# Patient Record
Sex: Female | Born: 1965 | Race: Black or African American | Hispanic: No | Marital: Married | Smoking: Never smoker
Health system: Southern US, Community
[De-identification: ages and names within clinical notes are randomized; demographics above are authoritative.]

## PROBLEM LIST (undated history)

## (undated) DIAGNOSIS — J45909 Unspecified asthma, uncomplicated: Secondary | ICD-10-CM

## (undated) DIAGNOSIS — E119 Type 2 diabetes mellitus without complications: Secondary | ICD-10-CM

## (undated) HISTORY — PX: TUBAL LIGATION: SHX77

---

## 2001-04-25 ENCOUNTER — Emergency Department (HOSPITAL_COMMUNITY): Admission: EM | Admit: 2001-04-25 | Discharge: 2001-04-25 | Payer: Self-pay | Admitting: Emergency Medicine

## 2002-06-29 ENCOUNTER — Emergency Department (HOSPITAL_COMMUNITY): Admission: EM | Admit: 2002-06-29 | Discharge: 2002-06-29 | Payer: Self-pay | Admitting: Emergency Medicine

## 2002-06-29 ENCOUNTER — Encounter: Payer: Self-pay | Admitting: Emergency Medicine

## 2008-01-13 ENCOUNTER — Emergency Department (HOSPITAL_COMMUNITY): Admission: EM | Admit: 2008-01-13 | Discharge: 2008-01-14 | Payer: Self-pay | Admitting: Emergency Medicine

## 2008-10-31 ENCOUNTER — Encounter: Admission: RE | Admit: 2008-10-31 | Discharge: 2008-10-31 | Payer: Self-pay | Admitting: Internal Medicine

## 2008-10-31 ENCOUNTER — Other Ambulatory Visit: Admission: RE | Admit: 2008-10-31 | Discharge: 2008-10-31 | Payer: Self-pay | Admitting: Obstetrics and Gynecology

## 2010-11-15 ENCOUNTER — Encounter: Payer: Self-pay | Admitting: Internal Medicine

## 2011-06-30 ENCOUNTER — Emergency Department (HOSPITAL_COMMUNITY)
Admission: EM | Admit: 2011-06-30 | Discharge: 2011-06-30 | Disposition: A | Discharge status: Home / Self Care | Payer: BC Managed Care – PPO | Attending: Emergency Medicine | Admitting: Emergency Medicine

## 2011-06-30 DIAGNOSIS — H9209 Otalgia, unspecified ear: Secondary | ICD-10-CM | POA: Insufficient documentation

## 2011-06-30 DIAGNOSIS — R599 Enlarged lymph nodes, unspecified: Secondary | ICD-10-CM | POA: Insufficient documentation

## 2011-06-30 DIAGNOSIS — J029 Acute pharyngitis, unspecified: Secondary | ICD-10-CM | POA: Insufficient documentation

## 2011-06-30 DIAGNOSIS — H6120 Impacted cerumen, unspecified ear: Secondary | ICD-10-CM | POA: Insufficient documentation

## 2011-06-30 DIAGNOSIS — M542 Cervicalgia: Secondary | ICD-10-CM | POA: Insufficient documentation

## 2011-06-30 DIAGNOSIS — R1310 Dysphagia, unspecified: Secondary | ICD-10-CM | POA: Insufficient documentation

## 2011-06-30 DIAGNOSIS — K219 Gastro-esophageal reflux disease without esophagitis: Secondary | ICD-10-CM | POA: Insufficient documentation

## 2011-06-30 LAB — RAPID STREP SCREEN (MED CTR MEBANE ONLY): Streptococcus, Group A Screen (Direct): NEGATIVE

## 2011-07-19 LAB — COMPREHENSIVE METABOLIC PANEL
ALT: 10
AST: 19
Albumin: 3.2 — ABNORMAL LOW
Alkaline Phosphatase: 81
BUN: 6
CO2: 29
Calcium: 8.8
Chloride: 104
Creatinine, Ser: 0.77
GFR calc Af Amer: >60
GFR calc non Af Amer: >60
Glucose, Bld: 136 — ABNORMAL HIGH
Potassium: 3.8
Sodium: 137
Total Bilirubin: 0.9
Total Protein: 6.3

## 2011-07-19 LAB — CBC
HCT: 35.8 — ABNORMAL LOW
Hemoglobin: 12
MCHC: 33.5
MCV: 92.9
Platelets: 259
RBC: 3.86 — ABNORMAL LOW
RDW: 12.9
WBC: 8.5

## 2011-07-19 LAB — DIFFERENTIAL
Basophils Absolute: 0
Basophils Relative: 0
Eosinophils Absolute: 0.3
Eosinophils Relative: 3
Lymphocytes Relative: 20
Lymphs Abs: 1.7
Monocytes Absolute: 0.4
Monocytes Relative: 5
Neutro Abs: 6.1
Neutrophils Relative %: 72

## 2011-07-19 LAB — LIPASE, BLOOD: Lipase: 13

## 2011-07-19 LAB — POCT CARDIAC MARKERS
CKMB, poc: <1 — ABNORMAL LOW
Myoglobin, poc: 39.4
Operator id: 294341
Troponin i, poc: <0.05

## 2014-04-15 ENCOUNTER — Other Ambulatory Visit: Payer: Self-pay | Admitting: Internal Medicine

## 2014-04-15 DIAGNOSIS — Z1231 Encounter for screening mammogram for malignant neoplasm of breast: Secondary | ICD-10-CM

## 2014-04-28 ENCOUNTER — Encounter (HOSPITAL_COMMUNITY): Payer: Self-pay | Admitting: Emergency Medicine

## 2014-04-28 ENCOUNTER — Emergency Department (HOSPITAL_COMMUNITY)
Admission: EM | Admit: 2014-04-28 | Discharge: 2014-04-28 | Disposition: A | Discharge status: Home / Self Care | Payer: BC Managed Care – PPO | Attending: Emergency Medicine | Admitting: Emergency Medicine

## 2014-04-28 DIAGNOSIS — R7309 Other abnormal glucose: Secondary | ICD-10-CM | POA: Insufficient documentation

## 2014-04-28 DIAGNOSIS — R739 Hyperglycemia, unspecified: Secondary | ICD-10-CM

## 2014-04-28 DIAGNOSIS — Y9289 Other specified places as the place of occurrence of the external cause: Secondary | ICD-10-CM | POA: Insufficient documentation

## 2014-04-28 DIAGNOSIS — T628X1A Toxic effect of other specified noxious substances eaten as food, accidental (unintentional), initial encounter: Secondary | ICD-10-CM | POA: Insufficient documentation

## 2014-04-28 DIAGNOSIS — T7840XA Allergy, unspecified, initial encounter: Secondary | ICD-10-CM

## 2014-04-28 DIAGNOSIS — L299 Pruritus, unspecified: Secondary | ICD-10-CM | POA: Insufficient documentation

## 2014-04-28 DIAGNOSIS — L551 Sunburn of second degree: Secondary | ICD-10-CM | POA: Insufficient documentation

## 2014-04-28 DIAGNOSIS — J45909 Unspecified asthma, uncomplicated: Secondary | ICD-10-CM | POA: Insufficient documentation

## 2014-04-28 DIAGNOSIS — Y9389 Activity, other specified: Secondary | ICD-10-CM | POA: Insufficient documentation

## 2014-04-28 HISTORY — DX: Unspecified asthma, uncomplicated: J45.909

## 2014-04-28 LAB — COMPREHENSIVE METABOLIC PANEL
ALT: 23 U/L (ref 0–35)
AST: 16 U/L (ref 0–37)
Albumin: 3.3 g/dL — ABNORMAL LOW (ref 3.5–5.2)
Alkaline Phosphatase: 100 U/L (ref 39–117)
Anion gap: 15 (ref 5–15)
BUN: 12 mg/dL (ref 6–23)
CO2: 22 mEq/L (ref 19–32)
Calcium: 8.9 mg/dL (ref 8.4–10.5)
Chloride: 97 mEq/L (ref 96–112)
Creatinine, Ser: 0.68 mg/dL (ref 0.50–1.10)
GFR calc Af Amer: >90 mL/min (ref >90)
GFR calc non Af Amer: >90 mL/min (ref >90)
Glucose, Bld: 333 mg/dL — ABNORMAL HIGH (ref 70–99)
Potassium: 4.2 mEq/L (ref 3.7–5.3)
Sodium: 134 mEq/L — ABNORMAL LOW (ref 137–147)
Total Bilirubin: 0.2 mg/dL — ABNORMAL LOW (ref 0.3–1.2)
Total Protein: 6.8 g/dL (ref 6.0–8.3)

## 2014-04-28 LAB — CBC WITH DIFFERENTIAL/PLATELET
Basophils Absolute: 0 10*3/uL (ref 0.0–0.1)
Basophils Relative: 0 % (ref 0–1)
Eosinophils Absolute: 0.2 10*3/uL (ref 0.0–0.7)
Eosinophils Relative: 2 % (ref 0–5)
HCT: 36.4 % (ref 36.0–46.0)
Hemoglobin: 12.8 g/dL (ref 12.0–15.0)
Lymphocytes Relative: 15 % (ref 12–46)
Lymphs Abs: 1.1 10*3/uL (ref 0.7–4.0)
MCH: 31.5 pg (ref 26.0–34.0)
MCHC: 35.2 g/dL (ref 30.0–36.0)
MCV: 89.7 fL (ref 78.0–100.0)
Monocytes Absolute: 0.2 10*3/uL (ref 0.1–1.0)
Monocytes Relative: 2 % — ABNORMAL LOW (ref 3–12)
Neutro Abs: 6 10*3/uL (ref 1.7–7.7)
Neutrophils Relative %: 81 % — ABNORMAL HIGH (ref 43–77)
Platelets: 167 10*3/uL (ref 150–400)
RBC: 4.06 MIL/uL (ref 3.87–5.11)
RDW: 12.6 % (ref 11.5–15.5)
WBC: 7.5 10*3/uL (ref 4.0–10.5)

## 2014-04-28 MED ORDER — METHYLPREDNISOLONE SODIUM SUCC 125 MG IJ SOLR
125.0000 mg | Freq: Once | INTRAMUSCULAR | Status: AC
  Administered 2014-04-28: 125 mg via INTRAVENOUS
  Filled 2014-04-28: qty 2

## 2014-04-28 MED ORDER — SILVER SULFADIAZINE 1 % EX CREA
TOPICAL_CREAM | Freq: Two times a day (BID) | CUTANEOUS | Status: DC
  Administered 2014-04-28: 14:00:00 via TOPICAL
  Filled 2014-04-28: qty 85

## 2014-04-28 MED ORDER — IBUPROFEN 600 MG PO TABS: 600.0000 mg | ORAL_TABLET | Freq: Four times a day (QID) | ORAL | Status: DC | PRN

## 2014-04-28 MED ORDER — DIPHENHYDRAMINE HCL 50 MG/ML IJ SOLN
25.0000 mg | Freq: Once | INTRAMUSCULAR | Status: AC
  Administered 2014-04-28: 25 mg via INTRAVENOUS
  Filled 2014-04-28: qty 1

## 2014-04-28 MED ORDER — METFORMIN HCL 500 MG PO TABS: 500.0000 mg | ORAL_TABLET | Freq: Every day | ORAL | Status: DC

## 2014-04-28 MED ORDER — FAMOTIDINE IN NACL 20-0.9 MG/50ML-% IV SOLN
20.0000 mg | Freq: Once | INTRAVENOUS | Status: AC
  Administered 2014-04-28: 20 mg via INTRAVENOUS
  Filled 2014-04-28: qty 50

## 2014-04-28 NOTE — ED Provider Notes (Addendum)
CSN: [id number]     Arrival date & time 04/28/14  1243 History   First MD Initiated Contact with Patient 04/28/14 1321     Chief Complaint  Patient presents with  . Allergic Reaction     (Consider location/radiation/quality/duration/timing/severity/associated sxs/prior Treatment) HPI Comments: Also pt states she was out in the sun all day yesterday in her tanktop and did not wear sunscreen.  This morning when she woke up her bilateral arms with swollen, blistered and burning.  Also noticed redness and warmth over her chest.  Patient is a 48 y.o. female presenting with allergic reaction. The history is provided by the patient.  Allergic Reaction Presenting symptoms: itching   Presenting symptoms comment:  Dry cough and feels like a lump in the throat after eating multiple foods at a 4th of July bbq.  Also known reaction to citrus and had citrus beverages yesterday and today Severity:  Moderate Prior allergic episodes:  Food/nut allergies Context: food   Relieved by:  Nothing Worsened by:  Nothing tried Ineffective treatments:  Antihistamines   Past Medical History  Diagnosis Date  . Asthma    Past Surgical History  Procedure Laterality Date  . Tubal ligation     No family history on file. History  Substance Use Topics  . Smoking status: Never Smoker   . Smokeless tobacco: Not on file  . Alcohol Use: Yes   OB History   Grav Para Term Preterm Abortions TAB SAB Ect Mult Living                 Review of Systems  Skin: Positive for itching.  All other systems reviewed and are negative.     Allergies  Citric acid; Lemonade flavor; Red dye; and Tomato  Home Medications   Prior to Admission medications   Not on File   BP 130/83  Pulse 103  Temp(Src) 98.1 F (36.7 C) (Oral)  Resp 13  SpO2 100%  LMP 04/08/2014 Physical Exam  Nursing note and vitals reviewed. Constitutional: She is oriented to person, place, and time. She appears well-developed and  well-nourished. No distress.  HENT:  Head: Normocephalic and atraumatic.  Mouth/Throat: Oropharynx is clear and moist. No uvula swelling.  No tongue swelling  Eyes: Conjunctivae and EOM are normal. Pupils are equal, round, and reactive to light.  Neck: Normal range of motion. Neck supple.  Cardiovascular: Normal rate, regular rhythm and intact distal pulses.   No murmur heard. Pulmonary/Chest: Effort normal and breath sounds normal. No respiratory distress. She has no wheezes. She has no rales.  Abdominal: Soft. She exhibits no distension. There is no tenderness. There is no rebound and no guarding.  Musculoskeletal: Normal range of motion. She exhibits no edema and no tenderness.  Neurological: She is alert and oriented to person, place, and time.  Skin: Skin is warm and dry. Rash noted. There is erythema.  Bilateral forearms with swelling, erythema, warmth and bulla with clear drainage.  Erythema to the upper chest that is also warm.  Normal area of skin where pt's tanktop was and no rash over the legs or abd  Psychiatric: She has a normal mood and affect. Her behavior is normal.    ED Course  Procedures (including critical care time) Labs Review Labs Reviewed  COMPREHENSIVE METABOLIC PANEL - Abnormal; Notable for the following:    Sodium 134 (*)    Glucose, Bld 333 (*)    Albumin 3.3 (*)    Total Bilirubin 0.2 (*)  All other components within normal limits  CBC WITH DIFFERENTIAL - Abnormal; Notable for the following:    Neutrophils Relative % 81 (*)    Monocytes Relative 2 (*)    All other components within normal limits    Imaging Review No results found.   EKG Interpretation None      MDM   Final diagnoses:  Sunburn of second degree  Allergic reaction, initial encounter  Hyperglycemia    Patient with 2 separate things going on. Initially after eating different foods at a Fourth of July picnic she developed some itching around her neck a dry cough and a feeling  of a lump in her throat has progressed overnight and did not improve with Benadryl. On exam patient is not wheezing as 98% oxygen saturation and has no oral swelling. Will treat with steroids, Pepcid and Benadryl.  Secondly patient is complaining of redness, blistering and pain to bilateral upper extremities. Patient states that she was outside all day long yesterday and did not wear sunscreen. It looks like a severe second degree sunburns. It does not itch.  It is hot to the touch. Patient denies any sulfa antibiotic use and states she was on azithromycin a week ago she finished the course and had no problems. The redness and blistering is only on her upper extremities. Also redness on her chest area with a visible line where her tank top was.  Will place silvadine cream  3:20 PM Pt feeling better and throat tightness gone.  On labs pt found to be hyperglycemic and states she was going back to her [facility] in aug for a check on her blood sugar.  Will start on metformin.  Will treat sunburn and will have pt use benadryl as needed for allergic reaction.  Gwyneth SproutWhitney Leisha Trinkle, MD 04/28/14 1521  Gwyneth SproutWhitney Cedric Denison, MD 04/28/14 1524

## 2014-04-28 NOTE — Discharge Instructions (Signed)
Allergies  Allergies may happen from anything your body is sensitive to. This may be food, medicines, pollens, chemicals, and many other things. Food allergies can be severe and deadly.  HOME CARE  If you do not know what causes a reaction, keep a diary. Write down the foods you ate and the symptoms that followed. Avoid foods that cause reactions.  If you have red raised spots (hives) or a rash:  Take medicine as told by your doctor.  Use medicines for red raised spots and itching as needed.  Apply cold cloths (compresses) to the skin. Take a cool bath. Avoid hot baths or showers.  If you are severely allergic:  It is often necessary to go to the hospital after you have treated your reaction.  Wear your medical alert jewelry.  You and your family must learn how to give a allergy shot or use an allergy kit (anaphylaxis kit).  Always carry your allergy kit or shot with you. Use this medicine as told by your doctor if a severe reaction is occurring. GET HELP RIGHT AWAY IF:  You have trouble breathing or are making high-pitched whistling sounds (wheezing).  You have a tight feeling in your chest or throat.  You have a puffy (swollen) mouth.  You have red raised spots, puffiness (swelling), or itching all over your body.  You have had a severe reaction that was helped by your allergy kit or shot. The reaction can return once the medicine has worn off.  You think you are having a food allergy. Symptoms most often happen within 30 minutes of eating a food.  Your symptoms have not gone away within 2 days or are getting worse.  You have new symptoms.  You want to retest yourself with a food or drink you think causes an allergic reaction. Only do this under the care of a doctor. MAKE SURE YOU:   Understand these instructions.  Will watch your condition.  Will get help right away if you are not doing well or get worse. Document Released: 02/05/2013 Document Reviewed:  02/05/2013 [facility] Patient Information 2015 [place]. This information is not intended to replace advice given to you by your health care provider. Make sure you discuss any questions you have with your health care provider.

## 2014-04-28 NOTE — ED Notes (Signed)
Pt reports that she is having a allergic reaction onset today. Pt presents with blistering of skin on her arms and redness between breasts. Pt thinks could be as result of food she ate yesterday.

## 2014-06-18 ENCOUNTER — Other Ambulatory Visit: Payer: Self-pay | Admitting: Obstetrics and Gynecology

## 2014-06-18 ENCOUNTER — Other Ambulatory Visit (HOSPITAL_COMMUNITY)
Admission: RE | Admit: 2014-06-18 | Discharge: 2014-06-18 | Disposition: A | Discharge status: Home / Self Care | Payer: BC Managed Care – PPO | Source: Ambulatory Visit | Attending: Obstetrics and Gynecology | Admitting: Obstetrics and Gynecology

## 2014-06-18 ENCOUNTER — Ambulatory Visit
Admission: RE | Admit: 2014-06-18 | Discharge: 2014-06-18 | Disposition: A | Discharge status: Home / Self Care | Payer: BC Managed Care – PPO | Source: Ambulatory Visit | Attending: Internal Medicine | Admitting: Internal Medicine

## 2014-06-18 DIAGNOSIS — Z1151 Encounter for screening for human papillomavirus (HPV): Secondary | ICD-10-CM | POA: Insufficient documentation

## 2014-06-18 DIAGNOSIS — Z01419 Encounter for gynecological examination (general) (routine) without abnormal findings: Secondary | ICD-10-CM | POA: Insufficient documentation

## 2014-06-18 DIAGNOSIS — Z1231 Encounter for screening mammogram for malignant neoplasm of breast: Secondary | ICD-10-CM

## 2014-06-24 LAB — CYTOLOGY - PAP

## 2014-07-02 ENCOUNTER — Ambulatory Visit: Payer: BC Managed Care – PPO

## 2014-07-09 ENCOUNTER — Ambulatory Visit: Payer: BC Managed Care – PPO

## 2014-07-16 ENCOUNTER — Ambulatory Visit: Payer: BC Managed Care – PPO

## 2015-05-01 ENCOUNTER — Other Ambulatory Visit: Payer: Self-pay | Admitting: Gastroenterology

## 2015-08-04 ENCOUNTER — Other Ambulatory Visit: Payer: Self-pay

## 2015-08-04 DIAGNOSIS — Z1231 Encounter for screening mammogram for malignant neoplasm of breast: Secondary | ICD-10-CM

## 2015-08-08 ENCOUNTER — Encounter (HOSPITAL_COMMUNITY): Payer: Self-pay | Admitting: *Deleted

## 2015-08-18 ENCOUNTER — Other Ambulatory Visit: Payer: Self-pay | Admitting: Gastroenterology

## 2015-08-18 ENCOUNTER — Ambulatory Visit (HOSPITAL_COMMUNITY)
Admission: RE | Admit: 2015-08-18 | Payer: BC Managed Care – PPO | Source: Ambulatory Visit | Admitting: Gastroenterology

## 2015-08-18 HISTORY — DX: Type 2 diabetes mellitus without complications: E11.9

## 2015-08-18 SURGERY — COLONOSCOPY WITH PROPOFOL
Anesthesia: Monitor Anesthesia Care

## 2015-08-18 MED ORDER — PROPOFOL 10 MG/ML IV BOLUS
INTRAVENOUS | Status: AC
  Filled 2015-08-18: qty 20

## 2015-08-20 ENCOUNTER — Ambulatory Visit
Admission: RE | Admit: 2015-08-20 | Discharge: 2015-08-20 | Disposition: A | Discharge status: Home / Self Care | Payer: BC Managed Care – PPO | Source: Ambulatory Visit

## 2015-08-20 DIAGNOSIS — Z1231 Encounter for screening mammogram for malignant neoplasm of breast: Secondary | ICD-10-CM

## 2015-10-29 ENCOUNTER — Encounter (HOSPITAL_COMMUNITY): Payer: Self-pay | Admitting: *Deleted

## 2015-11-10 ENCOUNTER — Encounter (HOSPITAL_COMMUNITY): Payer: Self-pay

## 2015-11-10 ENCOUNTER — Encounter (HOSPITAL_COMMUNITY)
Admission: RE | Disposition: A | Discharge status: Home / Self Care | Payer: Self-pay | Source: Ambulatory Visit | Attending: Gastroenterology

## 2015-11-10 ENCOUNTER — Ambulatory Visit (HOSPITAL_COMMUNITY)
Admission: RE | Admit: 2015-11-10 | Discharge: 2015-11-10 | Disposition: A | Discharge status: Home / Self Care | Payer: BC Managed Care – PPO | Source: Ambulatory Visit | Attending: Gastroenterology | Admitting: Gastroenterology

## 2015-11-10 ENCOUNTER — Ambulatory Visit (HOSPITAL_COMMUNITY): Payer: BC Managed Care – PPO | Admitting: Anesthesiology

## 2015-11-10 DIAGNOSIS — E119 Type 2 diabetes mellitus without complications: Secondary | ICD-10-CM | POA: Diagnosis not present

## 2015-11-10 DIAGNOSIS — K219 Gastro-esophageal reflux disease without esophagitis: Secondary | ICD-10-CM | POA: Insufficient documentation

## 2015-11-10 DIAGNOSIS — Z1211 Encounter for screening for malignant neoplasm of colon: Secondary | ICD-10-CM | POA: Diagnosis not present

## 2015-11-10 DIAGNOSIS — J45909 Unspecified asthma, uncomplicated: Secondary | ICD-10-CM | POA: Diagnosis not present

## 2015-11-10 DIAGNOSIS — E7800 Pure hypercholesterolemia, unspecified: Secondary | ICD-10-CM | POA: Diagnosis not present

## 2015-11-10 DIAGNOSIS — Z7984 Long term (current) use of oral hypoglycemic drugs: Secondary | ICD-10-CM | POA: Diagnosis not present

## 2015-11-10 HISTORY — PX: COLONOSCOPY WITH PROPOFOL: SHX5780

## 2015-11-10 LAB — GLUCOSE, CAPILLARY: Glucose-Capillary: 166 mg/dL — ABNORMAL HIGH (ref 65–99)

## 2015-11-10 SURGERY — COLONOSCOPY WITH PROPOFOL
Anesthesia: Monitor Anesthesia Care

## 2015-11-10 MED ORDER — LIDOCAINE HCL (CARDIAC) 20 MG/ML IV SOLN
INTRAVENOUS | Status: AC
  Filled 2015-11-10: qty 5

## 2015-11-10 MED ORDER — LIDOCAINE HCL (CARDIAC) 20 MG/ML IV SOLN
INTRAVENOUS | Status: DC | PRN
  Administered 2015-11-10: 50 mg via INTRAVENOUS

## 2015-11-10 MED ORDER — PROPOFOL 10 MG/ML IV BOLUS
INTRAVENOUS | Status: AC
  Filled 2015-11-10: qty 40

## 2015-11-10 MED ORDER — LACTATED RINGERS IV SOLN
INTRAVENOUS | Status: DC
  Administered 2015-11-10: 1000 mL via INTRAVENOUS

## 2015-11-10 MED ORDER — PROPOFOL 10 MG/ML IV BOLUS
INTRAVENOUS | Status: DC | PRN
  Administered 2015-11-10: 20 mg via INTRAVENOUS
  Administered 2015-11-10 (×3): 30 mg via INTRAVENOUS
  Administered 2015-11-10: 50 mg via INTRAVENOUS
  Administered 2015-11-10: 20 mg via INTRAVENOUS
  Administered 2015-11-10: 30 mg via INTRAVENOUS
  Administered 2015-11-10 (×2): 20 mg via INTRAVENOUS
  Administered 2015-11-10: 30 mg via INTRAVENOUS

## 2015-11-10 MED ORDER — SODIUM CHLORIDE 0.9 % IV SOLN: INTRAVENOUS | Status: DC

## 2015-11-10 SURGICAL SUPPLY — 22 items

## 2015-11-10 NOTE — Discharge Instructions (Signed)

## 2015-11-10 NOTE — Anesthesia Postprocedure Evaluation (Signed)
Anesthesia Post Note  Patient: Madison Scott  Procedure(s) Performed: Procedure(s) (LRB): COLONOSCOPY WITH PROPOFOL (N/A)  Patient location during evaluation: PACU Anesthesia Type: MAC Level of consciousness: awake and alert Pain management: pain level controlled Vital Signs Assessment: post-procedure vital signs reviewed and stable Respiratory status: spontaneous breathing, nonlabored ventilation, respiratory function stable and patient connected to nasal cannula oxygen Cardiovascular status: blood pressure returned to baseline and stable Postop Assessment: no signs of nausea or vomiting Anesthetic complications: no    Last Vitals:  Filed Vitals:   11/10/15 0942 11/10/15 0945  BP: 143/84   Pulse: 82 85  Temp:    Resp: 17 15    Last Pain: There were no vitals filed for this visit.               Lahela Woodin JENNETTE

## 2015-11-10 NOTE — H&P (Signed)
  Procedure: Baseline screening colonoscopy. No family history of colon cancer.  History: The patient is a 50 year old female born [birth year]. She is scheduled to undergo her first screening colonoscopy with polypectomy to prevent colon cancer.  Past medical history: Tubal ligation. Gastroesophageal reflux. Allergic rhinitis. Reactive airways disease. Lactose intolerance. Asthma. Type 2 diabetes mellitus. Hypercholesterolemia.  Allergies: Codeine. Penicillin. Tramadol. Latex.  Exam: The patient is alert and lying comfortably on the endoscopy stretcher. Abdomen soft and nontender to palpation. Lungs are clear to auscultation. Cardiac exam reveals a regular.  Plan: Proceed with baseline screening colonoscopy

## 2015-11-10 NOTE — Transfer of Care (Signed)
Immediate Anesthesia Transfer of Care Note  Patient: Madison Scott  Procedure(s) Performed: Procedure(s): COLONOSCOPY WITH PROPOFOL (N/A)  Patient Location: PACU  Anesthesia Type:MAC  Level of Consciousness: Patient easily awoken, sedated, comfortable, cooperative, following commands, responds to stimulation.   Airway & Oxygen Therapy: Patient spontaneously breathing, ventilating well, oxygen via simple oxygen mask.  Post-op Assessment: Report given to PACU RN, vital signs reviewed and stable, moving all extremities.   Post vital signs: Reviewed and stable.  Complications: No apparent anesthesia complications

## 2015-11-10 NOTE — Anesthesia Preprocedure Evaluation (Addendum)
Anesthesia Evaluation  Patient identified by MRN, date of birth, ID band Patient awake    Reviewed: Allergy & Precautions, NPO status , Patient's Chart, lab work & pertinent test results  History of Anesthesia Complications Negative for: history of anesthetic complications  Airway Mallampati: II  TM Distance: >3 FB Neck ROM: Full    Dental no notable dental hx. (+) Dental Advisory Given, Chipped, Poor Dentition, Missing   Pulmonary asthma ,    Pulmonary exam normal breath sounds clear to auscultation       Cardiovascular negative cardio ROS Normal cardiovascular exam Rhythm:Regular Rate:Normal     Neuro/Psych negative neurological ROS  negative psych ROS   GI/Hepatic negative GI ROS, Neg liver ROS,   Endo/Other  diabetes, Type 2, Oral Hypoglycemic Agents  Renal/GU negative Renal ROS  negative genitourinary   Musculoskeletal negative musculoskeletal ROS (+)   Abdominal   Peds negative pediatric ROS (+)  Hematology negative hematology ROS (+)   Anesthesia Other Findings   Reproductive/Obstetrics negative OB ROS                            Anesthesia Physical Anesthesia Plan  ASA: II  Anesthesia Plan: MAC   Post-op Pain Management:    Induction: Intravenous  Airway Management Planned: Nasal Cannula  Additional Equipment:   Intra-op Plan:   Post-operative Plan:   Informed Consent: I have reviewed the patients History and Physical, chart, labs and discussed the procedure including the risks, benefits and alternatives for the proposed anesthesia with the patient or authorized representative who has indicated his/her understanding and acceptance.   Dental advisory given  Plan Discussed with: CRNA  Anesthesia Plan Comments:         Anesthesia Quick Evaluation

## 2015-11-10 NOTE — Op Note (Signed)
Procedure: Baseline screening colonoscopy  Endoscopist: Danise EdgeMartin Janelli Welling  Premedication: Propofol administered by anesthesia  Procedure: The patient was placed in the left lateral decubitus position. Anal inspection and digital rectal exam were normal. The Pentax pediatric colonoscope was introduced into the rectum and advanced to the cecum. A normal-appearing appendiceal orifice and ileocecal valve were identified. Colonic preparation exam today was satisfactory. Withdrawal time was 10 minutes.  Rectum. Normal. Retroflex view of the distal rectum was normal  Sigmoid colon and descending colon. Normal  Splenic flexure. Normal.  Transverse colon. Normal  Hepatic flexure. Normal  Ascending colon. Normal  Cecum and ileocecal valve. Normal  Assessment: Normal screening colonoscopy  Recommendation: Schedule repeat screening colonoscopy in 10 years

## 2015-11-11 ENCOUNTER — Encounter (HOSPITAL_COMMUNITY): Payer: Self-pay | Admitting: Gastroenterology

## 2016-08-13 ENCOUNTER — Other Ambulatory Visit: Payer: Self-pay | Admitting: Internal Medicine

## 2016-08-13 DIAGNOSIS — Z1231 Encounter for screening mammogram for malignant neoplasm of breast: Secondary | ICD-10-CM

## 2016-08-27 ENCOUNTER — Ambulatory Visit
Admission: RE | Admit: 2016-08-27 | Discharge: 2016-08-27 | Disposition: A | Discharge status: Home / Self Care | Payer: BC Managed Care – PPO | Source: Ambulatory Visit | Attending: Internal Medicine | Admitting: Internal Medicine

## 2016-08-27 DIAGNOSIS — Z1231 Encounter for screening mammogram for malignant neoplasm of breast: Secondary | ICD-10-CM

## 2017-08-12 ENCOUNTER — Other Ambulatory Visit: Payer: Self-pay | Admitting: Internal Medicine

## 2017-08-12 DIAGNOSIS — Z1231 Encounter for screening mammogram for malignant neoplasm of breast: Secondary | ICD-10-CM

## 2017-11-25 ENCOUNTER — Ambulatory Visit
Admission: RE | Admit: 2017-11-25 | Discharge: 2017-11-25 | Disposition: A | Discharge status: Home / Self Care | Payer: BC Managed Care – PPO | Source: Ambulatory Visit | Attending: Internal Medicine | Admitting: Internal Medicine

## 2017-11-25 ENCOUNTER — Other Ambulatory Visit (HOSPITAL_COMMUNITY)
Admission: RE | Admit: 2017-11-25 | Discharge: 2017-11-25 | Disposition: A | Discharge status: Home / Self Care | Payer: BC Managed Care – PPO | Source: Ambulatory Visit | Attending: Obstetrics and Gynecology | Admitting: Obstetrics and Gynecology

## 2017-11-25 ENCOUNTER — Other Ambulatory Visit: Payer: Self-pay | Admitting: Obstetrics and Gynecology

## 2017-11-25 DIAGNOSIS — Z1231 Encounter for screening mammogram for malignant neoplasm of breast: Secondary | ICD-10-CM

## 2017-11-25 DIAGNOSIS — Z124 Encounter for screening for malignant neoplasm of cervix: Secondary | ICD-10-CM | POA: Diagnosis not present

## 2017-11-29 LAB — CYTOLOGY - PAP
Diagnosis: NEGATIVE
HPV: NOT DETECTED

## 2018-06-22 ENCOUNTER — Other Ambulatory Visit: Payer: Self-pay | Admitting: Internal Medicine

## 2018-06-22 DIAGNOSIS — Z1231 Encounter for screening mammogram for malignant neoplasm of breast: Secondary | ICD-10-CM

## 2018-11-29 ENCOUNTER — Ambulatory Visit
Admission: RE | Admit: 2018-11-29 | Discharge: 2018-11-29 | Disposition: A | Discharge status: Home / Self Care | Payer: BC Managed Care – PPO | Source: Ambulatory Visit | Attending: Internal Medicine | Admitting: Internal Medicine

## 2018-11-29 DIAGNOSIS — Z1231 Encounter for screening mammogram for malignant neoplasm of breast: Secondary | ICD-10-CM

## 2019-07-16 ENCOUNTER — Other Ambulatory Visit: Payer: Self-pay | Admitting: Obstetrics and Gynecology

## 2019-07-16 DIAGNOSIS — Z1231 Encounter for screening mammogram for malignant neoplasm of breast: Secondary | ICD-10-CM

## 2019-12-03 ENCOUNTER — Ambulatory Visit
Admission: RE | Admit: 2019-12-03 | Discharge: 2019-12-03 | Disposition: A | Discharge status: Home / Self Care | Payer: BC Managed Care – PPO | Source: Ambulatory Visit | Attending: Obstetrics and Gynecology | Admitting: Obstetrics and Gynecology

## 2019-12-03 ENCOUNTER — Other Ambulatory Visit: Payer: Self-pay

## 2019-12-03 DIAGNOSIS — Z1231 Encounter for screening mammogram for malignant neoplasm of breast: Secondary | ICD-10-CM

## 2019-12-06 ENCOUNTER — Other Ambulatory Visit: Payer: Self-pay | Admitting: Obstetrics and Gynecology

## 2019-12-06 DIAGNOSIS — Z1231 Encounter for screening mammogram for malignant neoplasm of breast: Secondary | ICD-10-CM

## 2020-12-08 ENCOUNTER — Ambulatory Visit
Admission: RE | Admit: 2020-12-08 | Discharge: 2020-12-08 | Disposition: A | Discharge status: Home / Self Care | Payer: BC Managed Care – PPO | Source: Ambulatory Visit | Attending: Obstetrics and Gynecology | Admitting: Obstetrics and Gynecology

## 2020-12-08 ENCOUNTER — Other Ambulatory Visit: Payer: Self-pay | Admitting: Obstetrics and Gynecology

## 2020-12-08 ENCOUNTER — Other Ambulatory Visit: Payer: Self-pay

## 2020-12-08 DIAGNOSIS — Z1231 Encounter for screening mammogram for malignant neoplasm of breast: Secondary | ICD-10-CM

## 2020-12-11 ENCOUNTER — Other Ambulatory Visit: Payer: Self-pay | Admitting: Obstetrics and Gynecology

## 2020-12-11 DIAGNOSIS — Z1231 Encounter for screening mammogram for malignant neoplasm of breast: Secondary | ICD-10-CM

## 2021-04-22 ENCOUNTER — Other Ambulatory Visit: Payer: Self-pay

## 2021-04-22 ENCOUNTER — Emergency Department (HOSPITAL_COMMUNITY): Payer: Worker's Compensation

## 2021-04-22 ENCOUNTER — Emergency Department (HOSPITAL_COMMUNITY)
Admission: EM | Admit: 2021-04-22 | Discharge: 2021-04-22 | Disposition: A | Discharge status: Home / Self Care | Payer: Worker's Compensation | Attending: Emergency Medicine | Admitting: Emergency Medicine

## 2021-04-22 DIAGNOSIS — E119 Type 2 diabetes mellitus without complications: Secondary | ICD-10-CM | POA: Diagnosis not present

## 2021-04-22 DIAGNOSIS — W01198A Fall on same level from slipping, tripping and stumbling with subsequent striking against other object, initial encounter: Secondary | ICD-10-CM | POA: Insufficient documentation

## 2021-04-22 DIAGNOSIS — S060X0A Concussion without loss of consciousness, initial encounter: Secondary | ICD-10-CM | POA: Insufficient documentation

## 2021-04-22 DIAGNOSIS — Y990 Civilian activity done for income or pay: Secondary | ICD-10-CM | POA: Diagnosis not present

## 2021-04-22 DIAGNOSIS — J45909 Unspecified asthma, uncomplicated: Secondary | ICD-10-CM | POA: Insufficient documentation

## 2021-04-22 DIAGNOSIS — Y93E5 Activity, floor mopping and cleaning: Secondary | ICD-10-CM | POA: Insufficient documentation

## 2021-04-22 DIAGNOSIS — S0990XA Unspecified injury of head, initial encounter: Secondary | ICD-10-CM | POA: Diagnosis present

## 2021-04-22 DIAGNOSIS — Z7984 Long term (current) use of oral hypoglycemic drugs: Secondary | ICD-10-CM | POA: Diagnosis not present

## 2021-04-22 MED ORDER — ACETAMINOPHEN 325 MG PO TABS
650.0000 mg | ORAL_TABLET | Freq: Once | ORAL | Status: AC
  Administered 2021-04-22: 650 mg via ORAL
  Filled 2021-04-22: qty 2

## 2021-04-22 NOTE — Discharge Instructions (Signed)
Please read and follow all provided instructions.  Your diagnoses today include:  1. Concussion without loss of consciousness, initial encounter   2. Injury of head, initial encounter     Tests performed today include: CT scan of your head that did not show any serious injury. Vital signs. See below for your results today.   Medications prescribed:  None  Take any prescribed medications only as directed.  Home care instructions:  Follow any educational materials contained in this packet.  BE VERY CAREFUL not to take multiple medicines containing Tylenol (also called acetaminophen). Doing so can lead to an overdose which can damage your liver and cause liver failure and possibly death.   Follow-up instructions: Please follow-up with your primary care provider next week for further evaluation of your symptoms if they are not getting better.    Return instructions:  SEEK IMMEDIATE MEDICAL ATTENTION IF: There is confusion or drowsiness (although children frequently become drowsy after injury).  You cannot awaken the injured person.  You have more than one episode of vomiting.  You notice dizziness or unsteadiness which is getting worse, or inability to walk.  You have convulsions or unconsciousness.  You experience severe, persistent headaches not relieved by Tylenol. You cannot use arms or legs normally.  There are changes in pupil sizes. (This is the black center in the colored part of the eye)  There is clear or bloody discharge from the nose or ears.  You have change in speech, vision, swallowing, or understanding.  Localized weakness, numbness, tingling, or change in bowel or bladder control. You have any other emergent concerns.  Additional Information: You have had a head injury which does not appear to require admission at this time.  Your vital signs today were: BP 127/70 (BP Location: Left Arm)   Pulse 68   Temp 98 F (36.7 C) (Oral)   Resp 18   SpO2 99%  If  your blood pressure (BP) was elevated above 135/85 this visit, please have this repeated by your doctor within one month. --------------

## 2021-04-22 NOTE — ED Triage Notes (Signed)
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 Pt states after she started to see double and got a headache. Pt states her vision has cleared up and she has a headache. Pain 5/10
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
## 2021-04-22 NOTE — ED Provider Notes (Signed)
Emergency Medicine Provider Triage Evaluation Note  Madison Scott , a 55 y.o. female  was evaluated in triage.  Pt complains of head injury.  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  After that she started to see double and had a headache.  She feels like her vision is back to normal now however still has ongoing headache.  She does not take any blood thinning medications, did not have loss of consciousness.  She denies other injuries..  Review of Systems  Positive: Headache, resolved double vision Negative: Fevers  Physical Exam  BP 130/81   Pulse 74   Temp 98.3 F (36.8 C) (Oral)   Resp 16   SpO2 99%  Gen:   Awake, no distress   Resp:  Normal effort  MSK:   Moves extremities without difficulty  Other:  Patient is awake and alert.  Speech is nonslurred.  Answers questions appropriately without difficulty.  Medical Decision Making  Medically screening exam initiated at 7:15 PM.  Appropriate orders placed.  Kathelyn Gombos Sansone was informed that the remainder of the evaluation will be completed by another provider, this initial triage assessment does not replace that evaluation, and the importance of remaining in the ED until their evaluation is complete.  Note: Portions of this report may have been transcribed using voice recognition software. Every effort was made to ensure accuracy; however, inadvertent computerized transcription errors may be present    Cristina Gong, PA-C 04/22/21 1916    Melene Plan, DO 04/22/21 2134

## 2021-04-29 ENCOUNTER — Encounter (HOSPITAL_COMMUNITY): Payer: Self-pay | Admitting: Emergency Medicine

## 2021-04-29 ENCOUNTER — Emergency Department (HOSPITAL_COMMUNITY)
Admission: EM | Admit: 2021-04-29 | Discharge: 2021-04-29 | Disposition: A | Discharge status: Home / Self Care | Payer: Worker's Compensation | Attending: Emergency Medicine | Admitting: Emergency Medicine

## 2021-04-29 ENCOUNTER — Other Ambulatory Visit: Payer: Self-pay

## 2021-04-29 ENCOUNTER — Emergency Department (HOSPITAL_COMMUNITY): Payer: Worker's Compensation

## 2021-04-29 DIAGNOSIS — H5461 Unqualified visual loss, right eye, normal vision left eye: Secondary | ICD-10-CM

## 2021-04-29 DIAGNOSIS — Y900 Blood alcohol level of less than 20 mg/100 ml: Secondary | ICD-10-CM | POA: Insufficient documentation

## 2021-04-29 DIAGNOSIS — W208XXA Other cause of strike by thrown, projected or falling object, initial encounter: Secondary | ICD-10-CM | POA: Diagnosis not present

## 2021-04-29 DIAGNOSIS — H53131 Sudden visual loss, right eye: Secondary | ICD-10-CM | POA: Diagnosis not present

## 2021-04-29 DIAGNOSIS — Z20822 Contact with and (suspected) exposure to covid-19: Secondary | ICD-10-CM | POA: Diagnosis not present

## 2021-04-29 DIAGNOSIS — R202 Paresthesia of skin: Secondary | ICD-10-CM | POA: Insufficient documentation

## 2021-04-29 DIAGNOSIS — S0990XA Unspecified injury of head, initial encounter: Secondary | ICD-10-CM | POA: Diagnosis present

## 2021-04-29 DIAGNOSIS — S060X0A Concussion without loss of consciousness, initial encounter: Secondary | ICD-10-CM | POA: Diagnosis not present

## 2021-04-29 DIAGNOSIS — Z7984 Long term (current) use of oral hypoglycemic drugs: Secondary | ICD-10-CM | POA: Diagnosis not present

## 2021-04-29 DIAGNOSIS — E119 Type 2 diabetes mellitus without complications: Secondary | ICD-10-CM | POA: Insufficient documentation

## 2021-04-29 DIAGNOSIS — J45909 Unspecified asthma, uncomplicated: Secondary | ICD-10-CM | POA: Insufficient documentation

## 2021-04-29 LAB — COMPREHENSIVE METABOLIC PANEL
ALT: 27 U/L (ref 0–44)
AST: 22 U/L (ref 15–41)
Albumin: 3.8 g/dL (ref 3.5–5.0)
Alkaline Phosphatase: 72 U/L (ref 38–126)
Anion gap: 7 (ref 5–15)
BUN: 16 mg/dL (ref 6–20)
CO2: 30 mmol/L (ref 22–32)
Calcium: 9.7 mg/dL (ref 8.9–10.3)
Chloride: 99 mmol/L (ref 98–111)
Creatinine, Ser: 0.82 mg/dL (ref 0.44–1.00)
GFR, Estimated: >60 mL/min (ref >60)
Glucose, Bld: 272 mg/dL — ABNORMAL HIGH (ref 70–99)
Potassium: 3.7 mmol/L (ref 3.5–5.1)
Sodium: 136 mmol/L (ref 135–145)
Total Bilirubin: 0.7 mg/dL (ref 0.3–1.2)
Total Protein: 7.3 g/dL (ref 6.5–8.1)

## 2021-04-29 LAB — ETHANOL: Alcohol, Ethyl (B): <10 mg/dL (ref <10)

## 2021-04-29 LAB — DIFFERENTIAL
Abs Immature Granulocytes: 0.01 10*3/uL (ref 0.00–0.07)
Basophils Absolute: 0 10*3/uL (ref 0.0–0.1)
Basophils Relative: 1 %
Eosinophils Absolute: 0.1 10*3/uL (ref 0.0–0.5)
Eosinophils Relative: 2 %
Immature Granulocytes: 0 %
Lymphocytes Relative: 25 %
Lymphs Abs: 1.2 10*3/uL (ref 0.7–4.0)
Monocytes Absolute: 0.3 10*3/uL (ref 0.1–1.0)
Monocytes Relative: 5 %
Neutro Abs: 3.4 10*3/uL (ref 1.7–7.7)
Neutrophils Relative %: 67 %

## 2021-04-29 LAB — CBC
HCT: 39.1 % (ref 36.0–46.0)
Hemoglobin: 13.1 g/dL (ref 12.0–15.0)
MCH: 30.8 pg (ref 26.0–34.0)
MCHC: 33.5 g/dL (ref 30.0–36.0)
MCV: 92 fL (ref 80.0–100.0)
Platelets: 223 10*3/uL (ref 150–400)
RBC: 4.25 MIL/uL (ref 3.87–5.11)
RDW: 13.1 % (ref 11.5–15.5)
WBC: 5 10*3/uL (ref 4.0–10.5)
nRBC: 0 % (ref 0.0–0.2)

## 2021-04-29 LAB — URINALYSIS, ROUTINE W REFLEX MICROSCOPIC
Bacteria, UA: NONE SEEN
Bilirubin Urine: NEGATIVE
Glucose, UA: >=500 mg/dL — AB
Hgb urine dipstick: NEGATIVE
Ketones, ur: NEGATIVE mg/dL
Leukocytes,Ua: NEGATIVE
Nitrite: NEGATIVE
Protein, ur: NEGATIVE mg/dL
Specific Gravity, Urine: 1.017 (ref 1.005–1.030)
pH: 5 (ref 5.0–8.0)

## 2021-04-29 LAB — RAPID URINE DRUG SCREEN, HOSP PERFORMED
Amphetamines: NOT DETECTED
Barbiturates: NOT DETECTED
Benzodiazepines: NOT DETECTED
Cocaine: NOT DETECTED
Opiates: NOT DETECTED
Tetrahydrocannabinol: NOT DETECTED

## 2021-04-29 LAB — I-STAT CHEM 8, ED
BUN: 22 mg/dL — ABNORMAL HIGH (ref 6–20)
Calcium, Ion: 1.23 mmol/L (ref 1.15–1.40)
Chloride: 99 mmol/L (ref 98–111)
Creatinine, Ser: 0.8 mg/dL (ref 0.44–1.00)
Glucose, Bld: 263 mg/dL — ABNORMAL HIGH (ref 70–99)
HCT: 40 % (ref 36.0–46.0)
Hemoglobin: 13.6 g/dL (ref 12.0–15.0)
Potassium: 4 mmol/L (ref 3.5–5.1)
Sodium: 138 mmol/L (ref 135–145)
TCO2: 31 mmol/L (ref 22–32)

## 2021-04-29 LAB — CBG MONITORING, ED: Glucose-Capillary: 86 mg/dL (ref 70–99)

## 2021-04-29 LAB — RESP PANEL BY RT-PCR (FLU A&B, COVID) ARPGX2
Influenza A by PCR: NEGATIVE
Influenza B by PCR: NEGATIVE
SARS Coronavirus 2 by RT PCR: NEGATIVE

## 2021-04-29 LAB — PROTIME-INR
INR: 0.9 (ref 0.8–1.2)
Prothrombin Time: 12.6 seconds (ref 11.4–15.2)

## 2021-04-29 LAB — APTT: aPTT: 27 seconds (ref 24–36)

## 2021-04-29 LAB — I-STAT BETA HCG BLOOD, ED (MC, WL, AP ONLY): I-stat hCG, quantitative: <5 m[IU]/mL (ref <5)

## 2021-04-29 MED ORDER — LORAZEPAM 2 MG/ML IJ SOLN
1.0000 mg | Freq: Once | INTRAMUSCULAR | Status: AC
  Administered 2021-04-29: 1 mg via INTRAVENOUS
  Filled 2021-04-29: qty 1

## 2021-04-29 NOTE — ED Provider Notes (Signed)
Emergency Medicine Provider Triage Evaluation Note  Madison Scott , a 55 y.o. female  was evaluated in triage.  Pt complains of right sided paresthesias x 1 week Had concussion last Wednesday, was seen in ED for vision changes and right sided tingling and weakness. States it got worse Saturday.   Review of Systems  Positive: Right sided numbness/tingling, vision changes, photophobia Negative: Headache   Physical Exam  There were no vitals taken for this visit. Gen:   Awake, no distress   Resp:  Normal effort  MSK:   Moves extremities without difficulty  Other:  Right leg and arm are weaker compared to the left. Decreased sensation to right leg and arm. CN 3-12 grossly in tact  Medical Decision Making  Medically screening exam initiated at 10:10 AM.  Appropriate orders placed.  Madison Scott was informed that the remainder of the evaluation will be completed by another provider, this initial triage assessment does not replace that evaluation, and the importance of remaining in the ED until their evaluation is complete.     Theron Arista, PA-C 04/29/21 1013    Arby Barrette, MD 04/30/21 [phone]

## 2021-04-29 NOTE — ED Notes (Signed)
Patient transported to MRI 

## 2021-04-29 NOTE — ED Provider Notes (Signed)
[facility] EMERGENCY DEPARTMENT Provider Note   CSN: [id number] Arrival date & time: 04/29/21  7353     History No chief complaint on file.   Madison Scott is a 55 y.o. female.  Pt presents to the ED today with vision loss to the right eye and paresthesias to the right arm and leg.  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  She said an emergency light fell on her head and she developed double vision.  Pt did come to the ED then (6/29) and had a neg ct and was d/c home.  She did follow up with her ophthalmologist and was told that she had some "floaters."  Pt said the double vision resolved, but then she developed these new sx on Saturday, July 2nd.  She said she can only see shadows out of the right eye.  She has occasional numbness in the right arm and right leg.  No weakness.        Past Medical History:  Diagnosis Date   Asthma    Diabetes mellitus without complication (HCC)     There are no problems to display for this patient.   Past Surgical History:  Procedure Laterality Date   COLONOSCOPY WITH PROPOFOL N/A 11/10/2015   Procedure: COLONOSCOPY WITH PROPOFOL;  Surgeon: Charolett Bumpers, MD;  Location: [facility] ENDOSCOPY;  Service: Endoscopy;  Laterality: N/A;   TUBAL LIGATION       OB History   No obstetric history on file.     No family history on file.  Social History   Tobacco Use   Smoking status: Never   Smokeless tobacco: Never  Substance Use Topics   Alcohol use: Yes    Comment: occaasional   Drug use: No    Home Medications Prior to Admission medications   Medication Sig Start Date End Date Taking? Authorizing Provider  Albuterol Sulfate (PROAIR RESPICLICK) 108 (90 Base) MCG/ACT AEPB Inhale 2 puffs into the lungs every 4 (four) hours as needed (wheezing, shortness of breath).    [provider]  budesonide-formoterol (SYMBICORT) 160-4.5 MCG/ACT inhaler Inhale 1 puff into the lungs daily as needed (shortness of  breath, wheezing).     [provider]  hydrocortisone 2.5 % cream Apply 1 application topically daily as needed (irritated skin).     [provider]  ibuprofen (ADVIL,MOTRIN) 800 MG tablet Take 800 mg by mouth 2 (two) times daily as needed for cramping.    [provider]  loratadine (CLARITIN) 10 MG tablet Take 10 mg by mouth daily.    [provider]  meloxicam (MOBIC) 15 MG tablet Take 15 mg by mouth daily.    [provider]  metFORMIN (GLUCOPHAGE) 500 MG tablet Take 1 tablet (500 mg total) by mouth daily with breakfast. Patient taking differently: Take 500 mg by mouth 2 (two) times daily with a meal.  04/28/14   Gwyneth Sprout, MD  ranitidine (ZANTAC) 150 MG tablet Take 150 mg by mouth 2 (two) times daily.    [provider]  simvastatin (ZOCOR) 20 MG tablet Take 20 mg by mouth at bedtime.    [provider]    Allergies    Penicillins  Review of Systems   Review of Systems  Eyes:  Positive for visual disturbance.  Neurological:  Positive for numbness.  All other systems reviewed and are negative.  Physical Exam Updated Vital Signs BP 136/81   Pulse 73   Temp 98.3  F (36.8 C)   Resp 15   SpO2 99%   Physical Exam Vitals and nursing note reviewed.  Constitutional:      Appearance: Normal appearance.  HENT:     Head: Normocephalic and atraumatic.     Right Ear: External ear normal.     Left Ear: External ear normal.     Nose: Nose normal.     Mouth/Throat:     Mouth: Mucous membranes are moist.     Pharynx: Oropharynx is clear.  Eyes:     Extraocular Movements: Extraocular movements intact.     Conjunctiva/sclera: Conjunctivae normal.     Pupils: Pupils are equal, round, and reactive to light.     Comments: Pt's pupils are reactive.  She is able to see light/dark, but is unable to see anything else out of the right eye.  Cardiovascular:     Rate and Rhythm: Normal rate and regular rhythm.     Pulses:  Normal pulses.     Heart sounds: Normal heart sounds.  Pulmonary:     Effort: Pulmonary effort is normal.     Breath sounds: Normal breath sounds.  Abdominal:     General: Abdomen is flat. Bowel sounds are normal.     Palpations: Abdomen is soft.  Musculoskeletal:        General: Normal range of motion.     Cervical back: Normal range of motion and neck supple.  Skin:    General: Skin is warm.     Capillary Refill: Capillary refill takes less than 2 seconds.  Neurological:     General: No focal deficit present.     Mental Status: She is alert and oriented to person, place, and time.  Psychiatric:        Mood and Affect: Mood normal.        Behavior: Behavior normal.    ED Results / Procedures / Treatments   Labs (all labs ordered are listed, but only abnormal results are displayed) Labs Reviewed  COMPREHENSIVE METABOLIC PANEL - Abnormal; Notable for the following components:      Result Value   Glucose, Bld 272 (*)    All other components within normal limits  URINALYSIS, ROUTINE W REFLEX MICROSCOPIC - Abnormal; Notable for the following components:   Glucose, UA >=500 (*)    All other components within normal limits  I-STAT CHEM 8, ED - Abnormal; Notable for the following components:   BUN 22 (*)    Glucose, Bld 263 (*)    All other components within normal limits  RESP PANEL BY RT-PCR (FLU A&B, COVID) ARPGX2  ETHANOL  PROTIME-INR  APTT  CBC  DIFFERENTIAL  RAPID URINE DRUG SCREEN, HOSP PERFORMED  I-STAT BETA HCG BLOOD, ED ([facility], [facility], [facility] ONLY)  CBG MONITORING, ED    EKG None  Radiology CT HEAD WO CONTRAST  Result Date: 04/29/2021 CLINICAL DATA:  Neuro deficit, acute, stroke suspected. Additional history provided: Patient diagnosed with concussion 1 week ago, worsening right-sided weakness with intermittent right eye vision loss. Vertex/occipital headache. EXAM: CT HEAD WITHOUT CONTRAST TECHNIQUE: Contiguous axial images were obtained from the base of the skull  through the vertex without intravenous contrast. COMPARISON:  Prior head CT 04/22/2021. FINDINGS: Brain: Cerebral volume is normal. There is no acute intracranial hemorrhage. No demarcated cortical infarct. No extra-axial fluid collection. No evidence of an intracranial mass. No midline shift. Vascular: No hyperdense vessel. Skull: Normal. Negative for fracture or focal lesion. Sinuses/Orbits: Visualized orbits show no acute finding.  No significant paranasal sinus disease at the imaged levels. IMPRESSION: No evidence of acute intracranial abnormality. Electronically Signed   By: Jackey Loge DO   On: 04/29/2021 11:38   MR BRAIN WO CONTRAST  Result Date: 04/29/2021 CLINICAL DATA:  Acute neuro deficit, stroke suspected. Right-sided tingling, weakness, and vision changes. Diagnosed with a concussion last week. EXAM: MRI HEAD WITHOUT CONTRAST TECHNIQUE: Multiplanar, multiecho pulse sequences of the brain and surrounding structures were obtained without intravenous contrast. COMPARISON:  Head CT 04/29/2021 FINDINGS: The study is motion degraded, most notably on the FLAIR sequence which is severely degraded. Brain: There is no evidence of an acute infarct, intracranial hemorrhage, mass, midline shift, or extra-axial fluid collection. The ventricles and sulci are normal. No significant white matter disease is evident. Vascular: Major intracranial vascular flow voids are preserved. Skull and upper cervical spine: Unremarkable bone marrow signal. Sinuses/Orbits: Unremarkable orbits. Clear paranasal sinuses. Trace bilateral mastoid fluid. Other: None. IMPRESSION: Unremarkable appearance of the brain on this motion degraded study. Electronically Signed   By: Sebastian Ache M.D.   On: 04/29/2021 20:07    Procedures Procedures   Medications Ordered in ED Medications  LORazepam (ATIVAN) injection 1 mg (1 mg Intravenous Given 04/29/21 1843)    ED Course  I have reviewed the triage vital signs and the nursing  notes.  Pertinent labs & imaging results that were available during my care of the patient were reviewed by me and considered in my medical decision making (see chart for details).    MDM Rules/Calculators/A&P                          No evidence of stroke.  Pt does have vision loss in the right eye, but still can see light and dark.  She will need to f/u with ophthalmology.  She only has an optometrist, so will be given the number to Dr. Vanessa Barbara.  Pt will also need to f/u in the concussion clinic.  Final Clinical Impression(s) / ED Diagnoses Final diagnoses:  Vision loss of right eye  Concussion without loss of consciousness, initial encounter    Rx / DC Orders ED Discharge Orders     None        Jacalyn Lefevre, MD 04/29/21 2044
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
## 2021-04-29 NOTE — ED Notes (Signed)
ED Provider at bedside. 

## 2021-05-11 ENCOUNTER — Ambulatory Visit (INDEPENDENT_AMBULATORY_CARE_PROVIDER_SITE_OTHER): Payer: Worker's Compensation | Admitting: Family Medicine

## 2021-05-11 ENCOUNTER — Other Ambulatory Visit: Payer: Self-pay

## 2021-05-11 VITALS — BP 125/70 | Ht 71.5 in | Wt 179.0 lb

## 2021-05-11 DIAGNOSIS — M542 Cervicalgia: Secondary | ICD-10-CM | POA: Diagnosis not present

## 2021-05-11 DIAGNOSIS — S060X0A Concussion without loss of consciousness, initial encounter: Secondary | ICD-10-CM

## 2021-05-11 MED ORDER — AMITRIPTYLINE HCL 10 MG PO TABS: 10.0000 mg | ORAL_TABLET | Freq: Every day | ORAL | 1 refills | Status: DC

## 2021-05-11 NOTE — Progress Notes (Signed)
Case Manager [facility] Marrianne Mood, RN, BSN, CCM Phone: [phone] Fax: [phone] [email or web address]
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
## 2021-05-12 ENCOUNTER — Encounter: Payer: Self-pay | Admitting: Family Medicine

## 2021-05-12 NOTE — Progress Notes (Signed)
PCP: Georgann Housekeeper, MD  Subjective:   HPI: Patient is a 55 y.o. female here for concussion.  Patient reports on 6/29 while at work the casing from an emergency light fell from the wall and struck her on the top of the head. No loss of consciousness. Initially had double vision, difficulty walking, headache. States double vision has improved. Continues to have headaches, neck soreness, photophobia, nausea. No benefit with tylenol. Having blurriness in right eye - scheduled to see ophthalmologist in 2-3 weeks. Also reports numbness in right arm and leg with feeling they are 'falling asleep' No bowel/bladder dysfunction. Had CTs of head x2 , MRI of brain in ED visits which were negative. No prior concussion or head injury. SCAT 21/22 symptoms, severity 106/132  Past Medical History:  Diagnosis Date   Asthma    Diabetes mellitus without complication (HCC)     Current Outpatient Medications on File Prior to Visit  Medication Sig Dispense Refill   Albuterol Sulfate (PROAIR RESPICLICK) 108 (90 Base) MCG/ACT AEPB Inhale 2 puffs into the lungs every 4 (four) hours as needed (wheezing, shortness of breath).     budesonide-formoterol (SYMBICORT) 160-4.5 MCG/ACT inhaler Inhale 1 puff into the lungs daily as needed (shortness of breath, wheezing).      famotidine (PEPCID) 20 MG tablet Take 20 mg by mouth 2 (two) times daily.     glimepiride (AMARYL) 2 MG tablet Take 2 mg by mouth 2 (two) times daily.     hydrocortisone 2.5 % cream Apply 1 application topically daily as needed (irritated skin).      ibuprofen (ADVIL,MOTRIN) 800 MG tablet Take 800 mg by mouth 2 (two) times daily as needed for cramping.     loratadine (CLARITIN) 10 MG tablet Take 10 mg by mouth daily.     meloxicam (MOBIC) 15 MG tablet Take 15 mg by mouth daily.     metFORMIN (GLUCOPHAGE) 1000 MG tablet Take 1,000 mg by mouth 2 (two) times daily.     metFORMIN (GLUCOPHAGE) 500 MG tablet Take 1 tablet (500 mg total) by mouth  daily with breakfast. (Patient taking differently: Take 500 mg by mouth 2 (two) times daily with a meal. ) 30 tablet 0   montelukast (SINGULAIR) 10 MG tablet Take 10 mg by mouth daily.     ranitidine (ZANTAC) 150 MG tablet Take 150 mg by mouth 2 (two) times daily.     simvastatin (ZOCOR) 20 MG tablet Take 20 mg by mouth at bedtime.     No current facility-administered medications on file prior to visit.    Past Surgical History:  Procedure Laterality Date   COLONOSCOPY WITH PROPOFOL N/A 11/10/2015   Procedure: COLONOSCOPY WITH PROPOFOL;  Surgeon: Charolett Bumpers, MD;  Location: [facility];  Service: Endoscopy;  Laterality: N/A;   TUBAL LIGATION      Allergies  Allergen Reactions   Penicillins Anaphylaxis    Has patient had a PCN reaction causing immediate rash, facial/tongue/throat swelling, SOB or lightheadedness with hypotension: Yes Has patient had a PCN reaction causing severe rash involving mucus membranes or skin necrosis: No Has patient had a PCN reaction that required hospitalization Yes Has patient had a PCN reaction occurring within the last 10 years: No If all of the above answers are "NO", then may proceed with Cephalosporin use.     Social History   Socioeconomic History   Marital status: Married    Spouse name: Not on file   Number of children: Not on file  Years of education: Not on file   Highest education level: Not on file  Occupational History   Not on file  Tobacco Use   Smoking status: Never   Smokeless tobacco: Never  Substance and Sexual Activity   Alcohol use: Yes    Comment: occaasional   Drug use: No   Sexual activity: Not on file  Other Topics Concern   Not on file  Social History Narrative   Not on file   Social Determinants of Health   Financial Resource Strain: Not on file  Food Insecurity: Not on file  Transportation Needs: Not on file  Physical Activity: Not on file  Stress: Not on file  Social Connections: Not on file   Intimate Partner Violence: Not on file    History reviewed. No pertinent family history.  BP 125/70   Ht 5' 11.5" (1.816 m)   Wt 179 lb (81.2 kg)   BMI 24.62 kg/m   No flowsheet data found.  No flowsheet data found.  Review of Systems: See HPI above.     Objective:  Physical Exam:  Gen: NAD, comfortable in exam room  Neuro: Orientation 4/5 (date) Immediate memory 8/15 Concentration 1/5 Neck paraspinal tenderness bilaterally with decreased flexion and extension - spurlings equivocal as reports right arm and leg are numb Unable to perform balance testing due to instability Finger to nose error on right, normal on left Delayed recall 0/5 Strength 3/5 with right wrist extension, elbow flexion and extension.  5/5 other upper and lower muscle groups Sensation diminished throughout right hand and medial and lateral lower leg MSRs 1+ equal bilaterally biceps, triceps, brachioradialis, patellar, achilles.  Assessment & Plan:  1. Concussion without loss of consciousness - very high symptom severity score with multiple symptoms.  Discussed physical and mental rest.  Will start amitriptyline at low dose for postconcussion syndrome.  Out of work in meantime.  Consider vestibular therapy, physical therapy in future depending on her progress and cervical spine MR results.  2. Neck pain with right upper and lower extremity numbness - no midline tenderness.  Will go ahead with MR cervical spine without contrast to assess for disc herniation and right sided cord irritation.  No bowel/bladder dysfunction.

## 2021-05-23 ENCOUNTER — Ambulatory Visit
Admission: RE | Admit: 2021-05-23 | Discharge: 2021-05-23 | Disposition: A | Discharge status: Home / Self Care | Payer: Worker's Compensation | Source: Ambulatory Visit | Attending: Family Medicine | Admitting: Family Medicine

## 2021-05-23 DIAGNOSIS — M542 Cervicalgia: Secondary | ICD-10-CM

## 2021-05-25 ENCOUNTER — Encounter: Payer: BC Managed Care – PPO | Admitting: Family Medicine

## 2021-05-25 ENCOUNTER — Ambulatory Visit (INDEPENDENT_AMBULATORY_CARE_PROVIDER_SITE_OTHER): Payer: Worker's Compensation | Admitting: Family Medicine

## 2021-05-25 ENCOUNTER — Other Ambulatory Visit: Payer: Self-pay

## 2021-05-25 VITALS — BP 124/72 | Ht 71.5 in | Wt 174.0 lb

## 2021-05-25 DIAGNOSIS — M542 Cervicalgia: Secondary | ICD-10-CM

## 2021-05-25 DIAGNOSIS — S060X0D Concussion without loss of consciousness, subsequent encounter: Secondary | ICD-10-CM | POA: Diagnosis not present

## 2021-05-25 NOTE — Patient Instructions (Addendum)
You're doing better but you're not there yet! Start home exercises for your neck and the vestibular exercises (double leg stance, single leg stance with eyes open - make sure someone is there to catch you if you are about to fall!). Start physical therapy and vestibular rehab. Try the amitriptyline earlier when you take the other medicine in the evening. Voltaren gel up to 4 times a day topically for your neck. Heat 15 minutes at a time as needed for your neck also. Out of work - follow up in 2 weeks for reevaluation. No driving - we will reevaluate in 2 weeks.

## 2021-05-26 ENCOUNTER — Encounter: Payer: Self-pay | Admitting: Family Medicine

## 2021-05-26 NOTE — Progress Notes (Signed)
PCP: Madison Housekeeper, MD  Subjective:   HPI: Patient is a 55 y.o. female here for concussion.  7/18: Patient reports on 6/29 while at work the casing from an emergency light fell from the wall and struck her on the top of the head. No loss of consciousness. Initially had double vision, difficulty walking, headache. States double vision has improved. Continues to have headaches, neck soreness, photophobia, nausea. No benefit with tylenol. Having blurriness in right eye - scheduled to see ophthalmologist in 2-3 weeks. Also reports numbness in right arm and leg with feeling they are 'falling asleep' No bowel/bladder dysfunction. Had CTs of head x2 , MRI of brain in ED visits which were negative. No prior concussion or head injury. SCAT 21/22 symptoms, severity 106/132  8/1: Patient reports she has improved since last visit. Neck still bothering her, headaches have improved but still there. Gets dizzy now more intermittently than regularly like when she bends down to pick something up or recently when she was at the grocery store. Has some peripheral vision loss right eye - seeing ophthalmology in about a week. Still repeating herself some at home. Using bengay on her neck with some relief. Just started using amitriptyline a couple days ago - feels groggy next day with this until about 10am. SCAT 12/22 symptoms, severity 45/132 Numbness in right arm and leg has improved significantly as well.  Past Medical History:  Diagnosis Date   Asthma    Diabetes mellitus without complication (HCC)     Current Outpatient Medications on File Prior to Visit  Medication Sig Dispense Refill   Albuterol Sulfate (PROAIR RESPICLICK) 108 (90 Base) MCG/ACT AEPB Inhale 2 puffs into the lungs every 4 (four) hours as needed (wheezing, shortness of breath).     amitriptyline (ELAVIL) 10 MG tablet Take 1 tablet (10 mg total) by mouth at bedtime. 30 tablet 1   budesonide-formoterol (SYMBICORT) 160-4.5  MCG/ACT inhaler Inhale 1 puff into the lungs daily as needed (shortness of breath, wheezing).      famotidine (PEPCID) 20 MG tablet Take 20 mg by mouth 2 (two) times daily.     glimepiride (AMARYL) 2 MG tablet Take 2 mg by mouth 2 (two) times daily.     hydrocortisone 2.5 % cream Apply 1 application topically daily as needed (irritated skin).      ibuprofen (ADVIL,MOTRIN) 800 MG tablet Take 800 mg by mouth 2 (two) times daily as needed for cramping.     loratadine (CLARITIN) 10 MG tablet Take 10 mg by mouth daily.     losartan (COZAAR) 100 MG tablet Take 100 mg by mouth daily.     meloxicam (MOBIC) 15 MG tablet Take 15 mg by mouth daily.     metFORMIN (GLUCOPHAGE) 1000 MG tablet Take 1,000 mg by mouth 2 (two) times daily.     metFORMIN (GLUCOPHAGE) 500 MG tablet Take 1 tablet (500 mg total) by mouth daily with breakfast. (Patient taking differently: Take 500 mg by mouth 2 (two) times daily with a meal. ) 30 tablet 0   montelukast (SINGULAIR) 10 MG tablet Take 10 mg by mouth daily.     ranitidine (ZANTAC) 150 MG tablet Take 150 mg by mouth 2 (two) times daily.     simvastatin (ZOCOR) 20 MG tablet Take 20 mg by mouth at bedtime.     No current facility-administered medications on file prior to visit.    Past Surgical History:  Procedure Laterality Date   COLONOSCOPY WITH PROPOFOL N/A 11/10/2015  Procedure: COLONOSCOPY WITH PROPOFOL;  Surgeon: Charolett Bumpers, MD;  Location: [facility];  Service: Endoscopy;  Laterality: N/A;   TUBAL LIGATION      Allergies  Allergen Reactions   Penicillins Anaphylaxis    Has patient had a PCN reaction causing immediate rash, facial/tongue/throat swelling, SOB or lightheadedness with hypotension: Yes Has patient had a PCN reaction causing severe rash involving mucus membranes or skin necrosis: No Has patient had a PCN reaction that required hospitalization Yes Has patient had a PCN reaction occurring within the last 10 years: No If all of the above  answers are "NO", then may proceed with Cephalosporin use.     Social History   Socioeconomic History   Marital status: Married    Spouse name: Not on file   Number of children: Not on file   Years of education: Not on file   Highest education level: Not on file  Occupational History   Not on file  Tobacco Use   Smoking status: Never   Smokeless tobacco: Never  Substance and Sexual Activity   Alcohol use: Yes    Comment: occaasional   Drug use: No   Sexual activity: Not on file  Other Topics Concern   Not on file  Social History Narrative   Not on file   Social Determinants of Health   Financial Resource Strain: Not on file  Food Insecurity: Not on file  Transportation Needs: Not on file  Physical Activity: Not on file  Stress: Not on file  Social Connections: Not on file  Intimate Partner Violence: Not on file    History reviewed. No pertinent family history.  BP 124/72   Ht 5' 11.5" (1.816 m)   Wt 174 lb (78.9 kg)   BMI 23.93 kg/m   No flowsheet data found.  No flowsheet data found.  Review of Systems: See HPI above.     Objective:  Physical Exam:  Gen: NAD, comfortable in exam room, lights on (off last visit)  Neuro: Orientation 5/5 Immediate memory 15/15 Concentration 5/5 Neck Right paraspinal tenderness, full motion Balance 0 errors double leg, unsteady with tandem Finger to nose normal bilaterally Delayed recall 1/5  Assessment & Plan:  1. Concussion without loss of consciousness - 2/2 work injury.  Much improved compared to last visit though still with intermittent dizziness.  Not yet ready to return to work.  Continue low dose amitriptyline but will take this a little earlier in the evening.  Start formal vestibular rehab with some home exercises also shown today to get started.  No driving.  F/u in 2 weeks.  2. Neck pain with right upper and lower extremity numbness - improved.  MRI reviewed with patient and noted multilevel degenerative  changes but nothing acute from the work injury on the imaging.  Consistent with cervical strain from the work injury.  Heat, voltaren gel.  Start physical therapy and home exercises.

## 2021-06-10 ENCOUNTER — Encounter: Payer: Self-pay | Admitting: Family Medicine

## 2021-06-10 ENCOUNTER — Ambulatory Visit (INDEPENDENT_AMBULATORY_CARE_PROVIDER_SITE_OTHER): Payer: Worker's Compensation | Admitting: Family Medicine

## 2021-06-10 ENCOUNTER — Other Ambulatory Visit: Payer: Self-pay

## 2021-06-10 VITALS — BP 120/82 | Ht 71.5 in | Wt 176.0 lb

## 2021-06-10 DIAGNOSIS — S060X0D Concussion without loss of consciousness, subsequent encounter: Secondary | ICD-10-CM

## 2021-06-10 DIAGNOSIS — M542 Cervicalgia: Secondary | ICD-10-CM

## 2021-06-10 NOTE — Progress Notes (Signed)
PCP: Georgann Housekeeper, MD  Subjective:   HPI: Patient is a 55 y.o. female here for concussion.  7/18: Patient reports on 6/29 while at work the casing from an emergency light fell from the wall and struck her on the top of the head. No loss of consciousness. Initially had double vision, difficulty walking, headache. States double vision has improved. Continues to have headaches, neck soreness, photophobia, nausea. No benefit with tylenol. Having blurriness in right eye - scheduled to see ophthalmologist in 2-3 weeks. Also reports numbness in right arm and leg with feeling they are 'falling asleep' No bowel/bladder dysfunction. Had CTs of head x2 , MRI of brain in ED visits which were negative. No prior concussion or head injury. SCAT 21/22 symptoms, severity 106/132  8/1: Patient reports she has improved since last visit. Neck still bothering her, headaches have improved but still there. Gets dizzy now more intermittently than regularly like when she bends down to pick something up or recently when she was at the grocery store. Has some peripheral vision loss right eye - seeing ophthalmology in about a week. Still repeating herself some at home. Using bengay on her neck with some relief. Just started using amitriptyline a couple days ago - feels groggy next day with this until about 10am. SCAT 12/22 symptoms, severity 45/132 Numbness in right arm and leg has improved significantly as well.  8/17: Patient reports she's doing extremely well. No complaints. Did 1 visit of PT/vestibular rehab and did not have complaints there. Has been doing home exercises. Has stopped taking amitriptyline too. SCAT symptoms 0/22  Past Medical History:  Diagnosis Date   Asthma    Diabetes mellitus without complication (HCC)     Current Outpatient Medications on File Prior to Visit  Medication Sig Dispense Refill   Albuterol Sulfate (PROAIR RESPICLICK) 108 (90 Base) MCG/ACT AEPB Inhale 2  puffs into the lungs every 4 (four) hours as needed (wheezing, shortness of breath).     budesonide-formoterol (SYMBICORT) 160-4.5 MCG/ACT inhaler Inhale 1 puff into the lungs daily as needed (shortness of breath, wheezing).      famotidine (PEPCID) 20 MG tablet Take 20 mg by mouth 2 (two) times daily.     glimepiride (AMARYL) 2 MG tablet Take 2 mg by mouth 2 (two) times daily.     hydrocortisone 2.5 % cream Apply 1 application topically daily as needed (irritated skin).      loratadine (CLARITIN) 10 MG tablet Take 10 mg by mouth daily.     losartan (COZAAR) 100 MG tablet Take 100 mg by mouth daily.     metFORMIN (GLUCOPHAGE) 1000 MG tablet Take 1,000 mg by mouth 2 (two) times daily.     montelukast (SINGULAIR) 10 MG tablet Take 10 mg by mouth daily.     ranitidine (ZANTAC) 150 MG tablet Take 150 mg by mouth 2 (two) times daily.     simvastatin (ZOCOR) 20 MG tablet Take 20 mg by mouth at bedtime.     No current facility-administered medications on file prior to visit.    Past Surgical History:  Procedure Laterality Date   COLONOSCOPY WITH PROPOFOL N/A 11/10/2015   Procedure: COLONOSCOPY WITH PROPOFOL;  Surgeon: Charolett Bumpers, MD;  Location: [facility];  Service: Endoscopy;  Laterality: N/A;   TUBAL LIGATION      Allergies  Allergen Reactions   Penicillins Anaphylaxis    Has patient had a PCN reaction causing immediate rash, facial/tongue/throat swelling, SOB or lightheadedness with hypotension: Yes Has patient  had a PCN reaction causing severe rash involving mucus membranes or skin necrosis: No Has patient had a PCN reaction that required hospitalization Yes Has patient had a PCN reaction occurring within the last 10 years: No If all of the above answers are "NO", then may proceed with Cephalosporin use.     Social History   Socioeconomic History   Marital status: Married    Spouse name: Not on file   Number of children: Not on file   Years of education: Not on file    Highest education level: Not on file  Occupational History   Not on file  Tobacco Use   Smoking status: Never   Smokeless tobacco: Never  Substance and Sexual Activity   Alcohol use: Yes    Comment: occaasional   Drug use: No   Sexual activity: Not on file  Other Topics Concern   Not on file  Social History Narrative   Not on file   Social Determinants of Health   Financial Resource Strain: Not on file  Food Insecurity: Not on file  Transportation Needs: Not on file  Physical Activity: Not on file  Stress: Not on file  Social Connections: Not on file  Intimate Partner Violence: Not on file    History reviewed. No pertinent family history.  BP 120/82   Ht 5' 11.5" (1.816 m)   Wt 176 lb (79.8 kg)   BMI 24.20 kg/m   No flowsheet data found.  No flowsheet data found.  Review of Systems: See HPI above.     Objective:  Physical Exam:  Gen: NAD, comfortable in exam room  Neuro: Immediate memory 15/15 Double leg 0 errors, tandem 0 errors, 1 error single leg. Finger to nose normal bilaterally. Delayed recall 5/5  Assessment & Plan:  1. Concussion without loss of consciousness - 2/2 work injury.  Completely recovered - at MMI without impairment.  Return to work full duty without restrictions.  2. Neck pain with right upper and lower extremity numbness - resolved.  2/2 cervical strain from work injury.

## 2021-12-10 ENCOUNTER — Ambulatory Visit
Admission: RE | Admit: 2021-12-10 | Discharge: 2021-12-10 | Disposition: A | Discharge status: Home / Self Care | Payer: BC Managed Care – PPO | Source: Ambulatory Visit | Attending: Obstetrics and Gynecology | Admitting: Obstetrics and Gynecology

## 2021-12-10 DIAGNOSIS — Z1231 Encounter for screening mammogram for malignant neoplasm of breast: Secondary | ICD-10-CM

## 2022-04-24 IMAGING — MR MR HEAD W/O CM
7 of 12 series · 23 of 48 positions shown · non-contrast
Comparison: Head CT 04/29/2021

CLINICAL DATA: Acute neuro deficit, stroke suspected. Right-sided
tingling, weakness, and vision changes. Diagnosed with a concussion
last week.

EXAM:
MRI HEAD WITHOUT CONTRAST
TECHNIQUE: Multiplanar, multiecho pulse sequences of the brain and surrounding
structures were obtained without intravenous contrast.

[Series 2: DWI · axial · 3.0mm · 0.94mm/px · z∈[-112,+31]mm · 6 of 106 slices shown (1 of 2)]
[im 1/106]
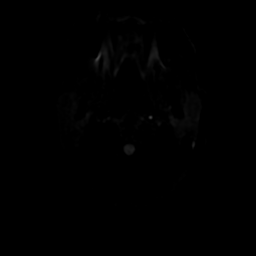
[im 22/106]
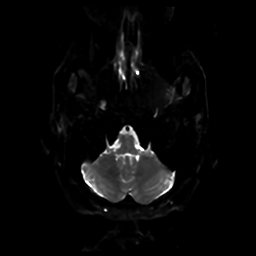
[im 43/106]
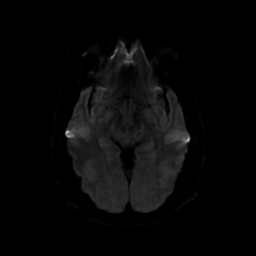
[im 64/106]
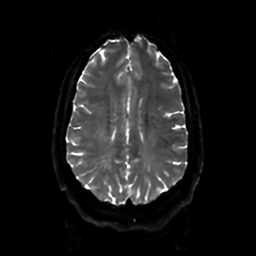
[im 85/106]
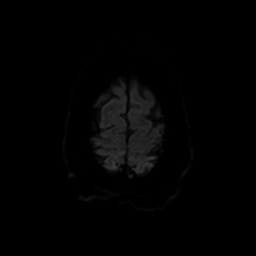
[im 106/106]
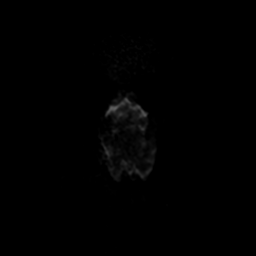

[Series 3: DWI · coronal · 4.0mm · 0.94mm/px · 4 of 74 slices shown (2 of 2)]
[im 1/74]
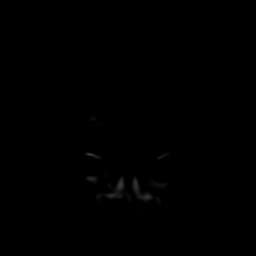
[im 25/74]
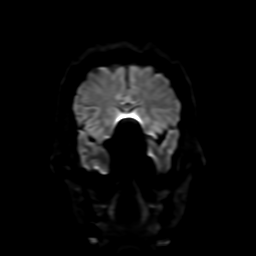
[im 49/74]
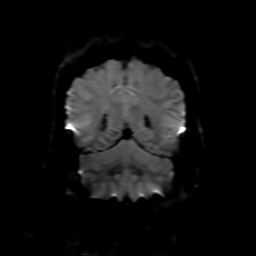
[im 74/74]
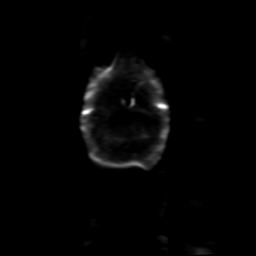

[Series 4: FLAIR · sagittal · 5.0mm · 0.23mm/px · 2 of 25 slices shown (1 of 2)]
[im 1/25]
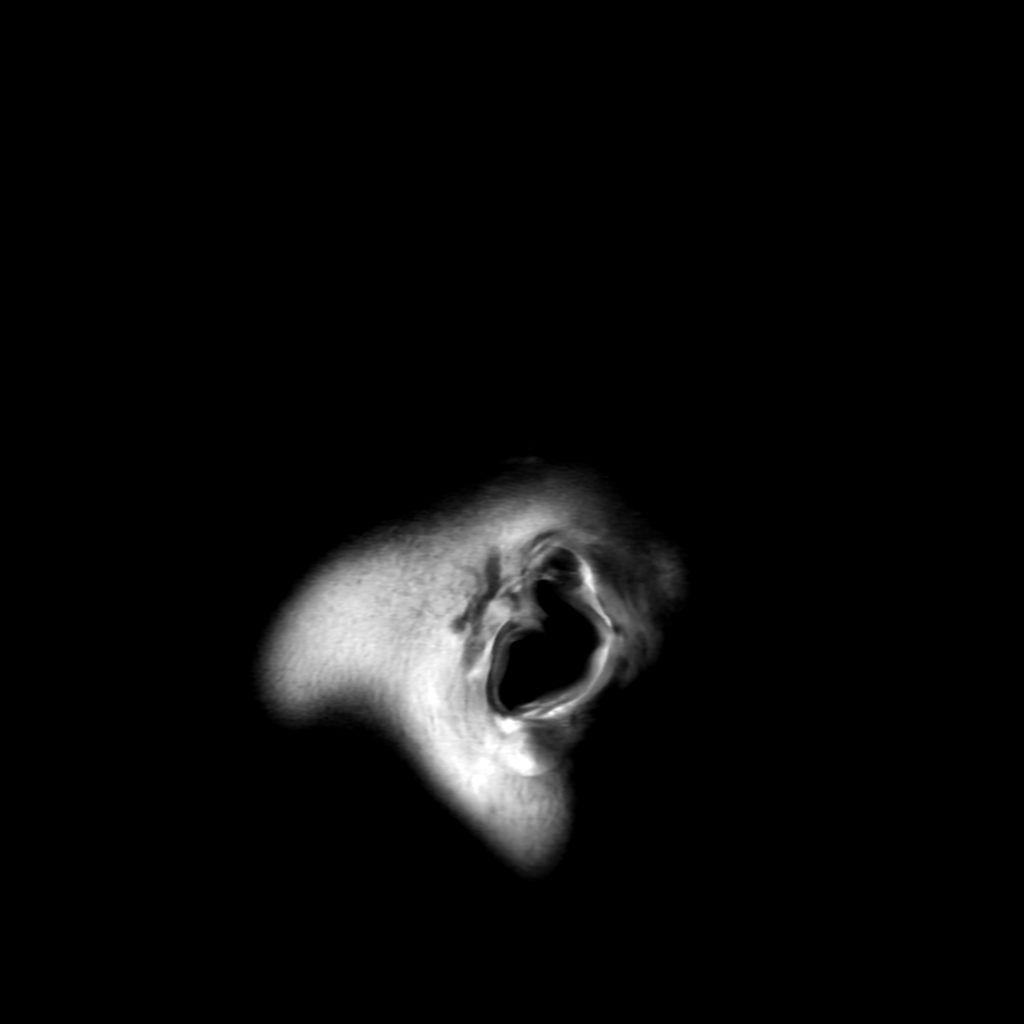
[im 25/25]
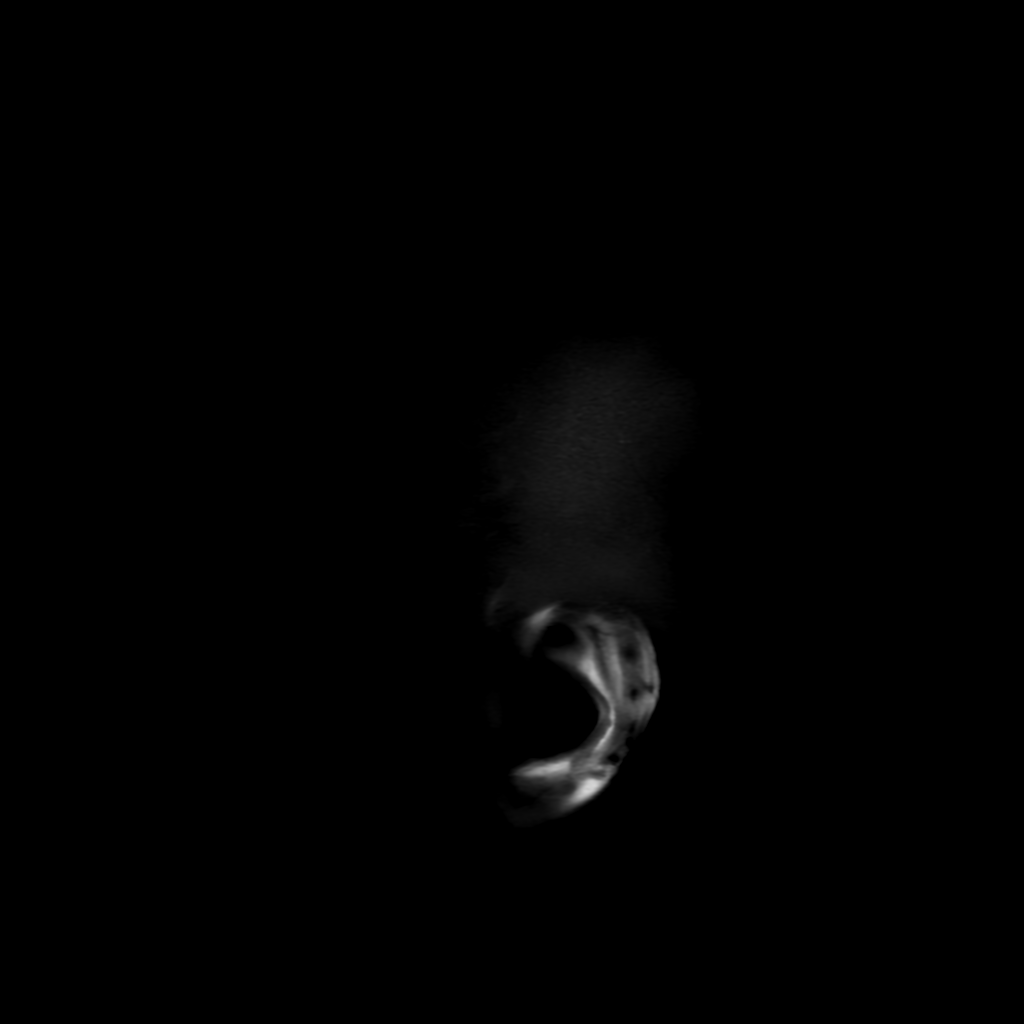

[Series 5: T2 · axial · 5.0mm · 0.23mm/px · 1 of 28 slices shown]
[im 1/28]
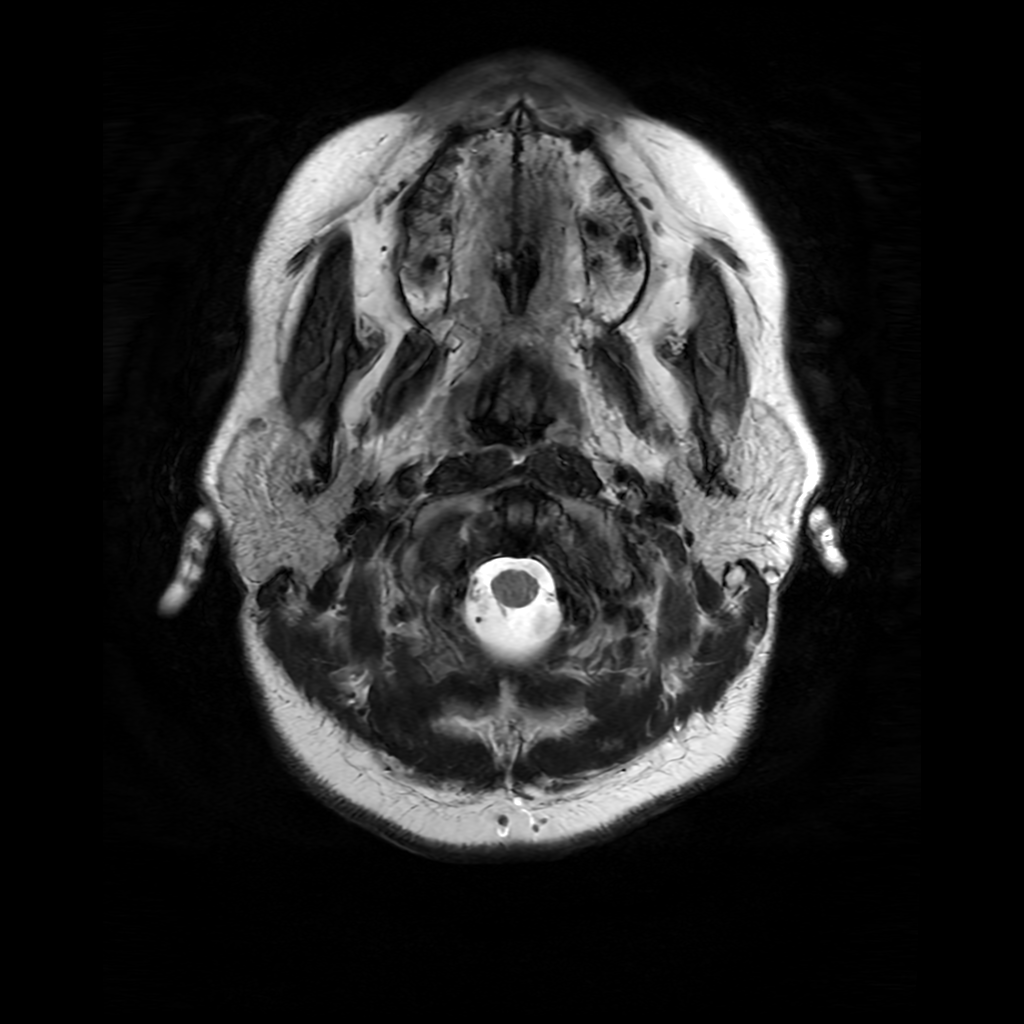

[Series 6: FLAIR · axial · 4.0mm · 0.45mm/px · z∈[-111,+34]mm · 3 of 37 slices shown (2 of 2)]
[im 1/37]
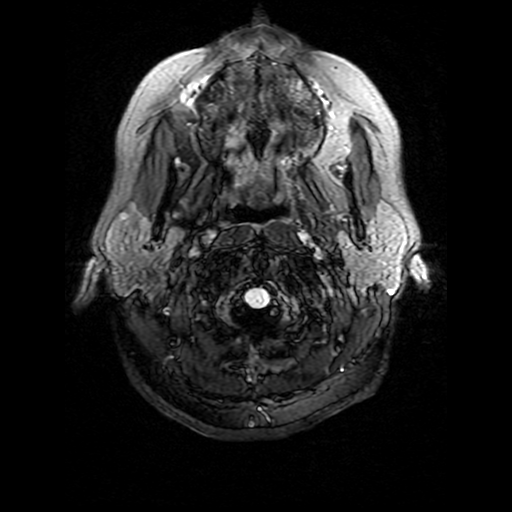
[im 19/37]
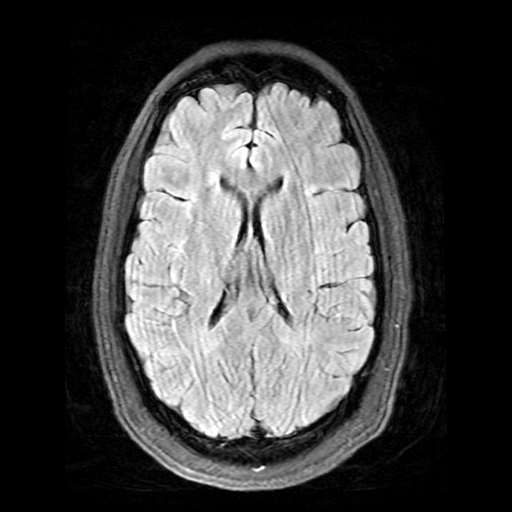
[im 37/37]
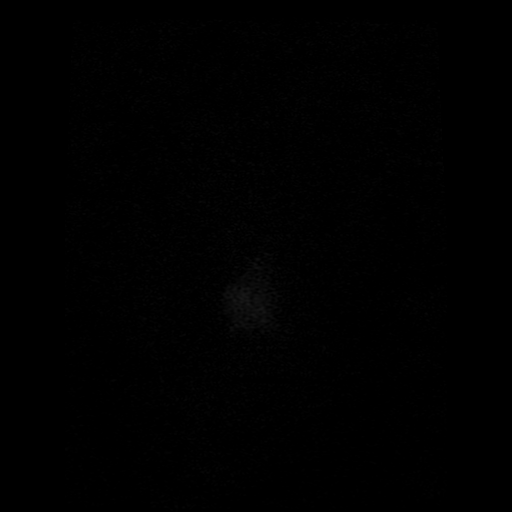

[Series 250: ADC · axial · 3.0mm · 0.94mm/px · z∈[-112,+31]mm · 4 of 53 slices shown (1 of 2)]
[im 1/53]
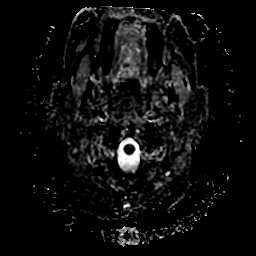
[im 18/53]
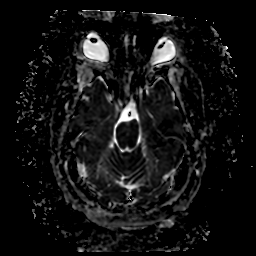
[im 35/53]
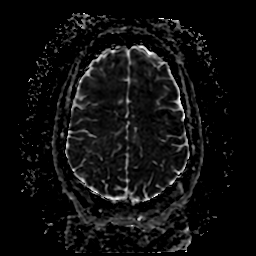
[im 53/53]
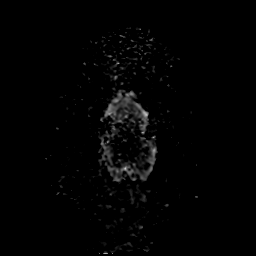

[Series 350: ADC · coronal · 4.0mm · 0.94mm/px · 3 of 37 slices shown (2 of 2)]
[im 1/37]
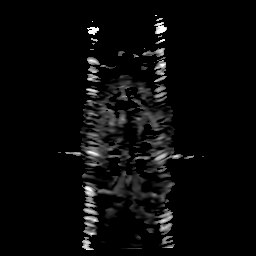
[im 19/37]
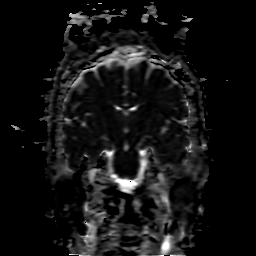
[im 37/37]
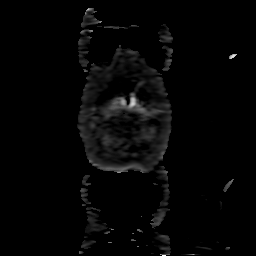

[23 of 48 positions shown; findings below may reference images not displayed]

FINDINGS: The study is motion degraded, most notably on the FLAIR sequence
which is severely degraded.

Brain: There is no evidence of an acute infarct, intracranial
hemorrhage, mass, midline shift, or extra-axial fluid collection.
The ventricles and sulci are normal. No significant white matter
disease is evident.

Vascular: Major intracranial vascular flow voids are preserved.

Skull and upper cervical spine: Unremarkable bone marrow signal.

Sinuses/Orbits: Unremarkable orbits. Clear paranasal sinuses. Trace
bilateral mastoid fluid.

Other: None.
IMPRESSION: Unremarkable appearance of the brain on this motion degraded study.

## 2022-11-30 ENCOUNTER — Other Ambulatory Visit: Payer: Self-pay | Admitting: Internal Medicine

## 2022-11-30 DIAGNOSIS — Z1231 Encounter for screening mammogram for malignant neoplasm of breast: Secondary | ICD-10-CM

## 2023-03-30 ENCOUNTER — Ambulatory Visit
Admission: RE | Admit: 2023-03-30 | Discharge: 2023-03-30 | Disposition: A | Discharge status: Home / Self Care | Payer: BC Managed Care – PPO | Source: Ambulatory Visit | Attending: Internal Medicine | Admitting: Internal Medicine

## 2023-03-30 DIAGNOSIS — Z1231 Encounter for screening mammogram for malignant neoplasm of breast: Secondary | ICD-10-CM

## 2023-12-13 ENCOUNTER — Other Ambulatory Visit: Payer: Self-pay | Admitting: Internal Medicine

## 2023-12-13 DIAGNOSIS — Z0000 Encounter for general adult medical examination without abnormal findings: Secondary | ICD-10-CM

## 2024-04-13 ENCOUNTER — Ambulatory Visit: Payer: Self-pay

## 2024-06-20 ENCOUNTER — Ambulatory Visit
Admission: RE | Admit: 2024-06-20 | Discharge: 2024-06-20 | Disposition: A | Discharge status: Home / Self Care | Source: Ambulatory Visit | Attending: Internal Medicine | Admitting: Internal Medicine

## 2024-06-20 DIAGNOSIS — Z0000 Encounter for general adult medical examination without abnormal findings: Secondary | ICD-10-CM
